# Patient Record
Sex: Female | Born: 1970 | Race: White | Hispanic: No | Marital: Married
Health system: Southern US, Community
[De-identification: ages and names within clinical notes are randomized; demographics above are authoritative.]

## PROBLEM LIST (undated history)

## (undated) DIAGNOSIS — M1990 Unspecified osteoarthritis, unspecified site: Secondary | ICD-10-CM

## (undated) HISTORY — DX: Unspecified osteoarthritis, unspecified site: M19.90

---

## 2000-06-05 ENCOUNTER — Ambulatory Visit (HOSPITAL_COMMUNITY): Admission: RE | Admit: 2000-06-05 | Discharge: 2000-06-05 | Payer: Self-pay | Admitting: Obstetrics and Gynecology

## 2000-06-05 ENCOUNTER — Encounter (INDEPENDENT_AMBULATORY_CARE_PROVIDER_SITE_OTHER): Payer: Self-pay | Admitting: Specialist

## 2000-08-16 ENCOUNTER — Other Ambulatory Visit: Admission: RE | Admit: 2000-08-16 | Discharge: 2000-08-16 | Payer: Self-pay | Admitting: Obstetrics and Gynecology

## 2001-07-09 ENCOUNTER — Encounter: Payer: Self-pay | Admitting: Obstetrics and Gynecology

## 2001-07-09 ENCOUNTER — Ambulatory Visit (HOSPITAL_COMMUNITY): Admission: RE | Admit: 2001-07-09 | Discharge: 2001-07-09 | Payer: Self-pay | Admitting: Obstetrics and Gynecology

## 2002-03-10 ENCOUNTER — Ambulatory Visit (HOSPITAL_COMMUNITY): Admission: RE | Admit: 2002-03-10 | Discharge: 2002-03-10 | Payer: Self-pay | Admitting: Obstetrics and Gynecology

## 2002-10-26 ENCOUNTER — Other Ambulatory Visit: Admission: RE | Admit: 2002-10-26 | Discharge: 2002-10-26 | Payer: Self-pay | Admitting: Obstetrics and Gynecology

## 2003-05-18 ENCOUNTER — Inpatient Hospital Stay (HOSPITAL_COMMUNITY): Admission: AD | Admit: 2003-05-18 | Discharge: 2003-05-21 | Payer: Self-pay | Admitting: Obstetrics and Gynecology

## 2003-06-25 ENCOUNTER — Other Ambulatory Visit: Admission: RE | Admit: 2003-06-25 | Discharge: 2003-06-25 | Payer: Self-pay | Admitting: Obstetrics and Gynecology

## 2004-06-23 ENCOUNTER — Other Ambulatory Visit: Admission: RE | Admit: 2004-06-23 | Discharge: 2004-06-23 | Payer: Self-pay | Admitting: Obstetrics and Gynecology

## 2005-06-28 ENCOUNTER — Other Ambulatory Visit: Admission: RE | Admit: 2005-06-28 | Discharge: 2005-06-28 | Payer: Self-pay | Admitting: Obstetrics and Gynecology

## 2006-09-06 ENCOUNTER — Inpatient Hospital Stay (HOSPITAL_COMMUNITY): Admission: RE | Admit: 2006-09-06 | Discharge: 2006-09-09 | Payer: Self-pay | Admitting: Obstetrics and Gynecology

## 2008-09-15 ENCOUNTER — Encounter: Admission: RE | Admit: 2008-09-15 | Discharge: 2008-09-15 | Payer: Self-pay | Admitting: Sports Medicine

## 2010-09-22 NOTE — H&P (Signed)
   NAME:  Leslie [name], Leslie [name]                       ACCOUNT NO.:  [id number]   MEDICAL RECORD NO.:  [id number]                   PATIENT TYPE:  AMB   LOCATION:  SDC                                  FACILITY:  [facility]   PHYSICIAN:  Dineen Kid. Rana Snare, M.D.                 DATE OF BIRTH:  1970/12/30   DATE OF ADMISSION:  03/10/2002  DATE OF DISCHARGE:                                HISTORY & PHYSICAL   HISTORY OF PRESENT ILLNESS:  The patient is a 40 year old G1 P0 with  worsening dysmenorrhea currently trying to conceive, does not want to take  oral contraceptive agents, is taking anti-inflammatory medications with her  menses, desires definitive laparoscopic evaluation and then treatment of  this for possible endometriosis.  She does have a history of right ovarian  cyst in April, appears to have resolved.  She does continue to have a lot of  pelvic pain and pressure with her cycles.   PHYSICAL EXAMINATION:  VITAL SIGNS: Blood pressure is 102/60.  HEART: Regular rate and rhythm.  LUNGS: Clear to auscultation bilaterally.  ABDOMEN: Nondistended, nontender.  PELVIC: Uterus is anteverted, mobile, nontender.  No adnexal masses are  palpable.  No uterosacral nodularity.   MEDICATIONS:  None - prenatal vitamins otherwise.   ALLERGIES:  She is allergic to SULFA.   IMPRESSION AND PLAN:  Pelvic pain and dysmenorrhea not responsive to  conservative medical management.  She desires fertility and not a good  candidate for birth control pills at this time.  Plan laparoscopic  evaluation and treatment.  We will also plan chromopertubation at the time  despite having had a normal HSG in the past.  Risks and benefits were  discussed at length which include but not limited to risk of infection,  bleeding, damage to uterus, tubes, ovaries, bowel or bladder.  Informed  consent was obtained.                                               Dineen Kid Rana Snare, M.D.    DCL/MEDQ  D:  03/10/2002  T:  03/10/2002   Job:  [id number]

## 2010-09-22 NOTE — Op Note (Signed)
Leslie [name], Leslie [name] NO.:  [id number]   MEDICAL RECORD NO.:  [id number]          PATIENT TYPE:  INP   LOCATION:  9199                          FACILITY:  [facility]   PHYSICIAN:  Dineen Kid. Rana Snare, M.D.    DATE OF BIRTH:  Mar 26, 1971   DATE OF PROCEDURE:  09/06/2006  DATE OF DISCHARGE:                               OPERATIVE REPORT   PREOPERATIVE DIAGNOSES:  1. Intrauterine pregnancy at 39-1/2 weeks' gestational age.  2. Unstable fetal presentation.  3. Maternal desire for primary cesarean section.   PREOPERATIVE DIAGNOSES:  1. Intrauterine pregnancy at 39-1/2 weeks' gestational age.  2. Unstable fetal presentation.  3. Maternal desire for primary cesarean section.   PROCEDURE:  Primary low segment transverse section.   SURGEON:  Dr. Candice Camp.   ANESTHESIA:  Spinal.   INDICATIONS:  [name] is a 40 year old G3, P1 at 39-1/2 weeks seen  yesterday in the office for routine obstetrical care and found to have  transverse presentation.  She declines version attempt.  She presents  today for primary cesarean section.  Upon presentation, ultrasound shows  oblique to vertex presentation.  The patient was offered induction of  labor but desires to proceed with primary cesarean section due to stable  fetal presentation.  The risks and benefits were discussed.  Informed  consent was obtained   FINDINGS AT THE TIME OF SURGERY:  Viable female infant, Apgars were 8  and 9, pH arterial 7.28, weight 6 pounds 15 ounces.   DESCRIPTION OF PROCEDURE:  After adequate analgesia, the patient placed  in the supine position with left lateral tilt.  She was sterilely  prepped and draped.  The bladder sterilely drained with a Foley  catheter.  Pfannenstiel skin incision was made two fingerbreadths above  the pubic symphysis, taken down sharply to fascia which was incised  transversely, extended superiorly and inferiorly off the bellies of the  rectus muscle which was separated sharply  in the midline.  Peritoneum  was entered sharply.  Bladder flap created and placed behind the bladder  blade.  A low segment myotomy incision was made down to the infant's  vertex and extended laterally with the operator's fingertips.  Vertex  was delivered atraumatically.  The nares and pharynx suctioned.  Infant  was then delivered, cord clamped and cut, handed to the pediatrician for  resuscitation.  Cord blood was obtained.  Placenta extracted manually.  Uterus was exteriorized, wiped clean with a dry lap.  The myotomy  incision closed in 2 layers, first being a running locking layer, the  second being an imbricating layer of 0 Monocryl suture.  Uterus was  placed back in the peritoneal cavity, and after a copious amount of  irrigation, adequate hemostasis was assured.  The peritoneum was closed  with 0 Monocryl.  Rectus muscle plicated in the midline.  Irrigation was  applied, and after adequate hemostasis, the fascia was then closed with  a 0 PDS in running fashion.  Irrigation was applied, and after adequate  hemostasis was assured, the skin staples and Steri-Strips applied.  The  patient tolerated the procedure  well, was stable on transfer to the  recovery room.  Sponge and instrument count was normal x3.  Estimated  blood loss 600 mL.  She did receive 1 grams Rocephin after delivery of  the placenta.      Dineen Kid Rana Snare, M.D.  Electronically Signed     DCL/MEDQ  D:  09/06/2006  T:  09/06/2006  Job:  [id number]

## 2010-09-22 NOTE — Op Note (Signed)
[facility]  Patient:    Leslie [name], Leslie [name]                    MRN: [id number] Proc. Date: 06/05/00 Adm. Date:  [id number] Attending:  Trevor Iha                           Operative Report  PREOPERATIVE DIAGNOSIS:       Missed abortion, approximately 6-1/2 weeks size.  POSTOPERATIVE DIAGNOSIS:      Missed abortion, approximately 6-1/2 weeks size.  PROCEDURE:                    Dilation and evacuation.  SURGEON:                      Trevor Iha, M.D.  ANESTHESIA:                   Monitored anesthetic care and paracervical                               block.  ESTIMATED BLOOD LOSS:         20 cc.  INDICATIONS:                  [name] is a 40 year old, G1, P0, with vaginal bleeding. Beta HCGs were not appropriately rising. Ultrasound shows a 6-1/2 week size gestational sac, no fetal pole or heartbeat. She presents today for dilation and evacuation. Risks and benefits were discussed at length. Informed consent was obtained.  DESCRIPTION OF PROCEDURE:            After adequate analgesia, the patient was placed in the dorsal lithotomy position. She was sterilely prepped and draped, the bladder was sterilely drained. Paracervical block was placed and the anterior lip was injected with 1% Xylocaine with epinephrine. Tenaculum was placed, uterus was sounded to 9 cm, easily dilated to a #27 Hegar dilator. A 7 mm suction curet was inserted and the products of conception were retrieved. This was performed until it was felt the endometrial cavity was empty as evidenced by a gritty surface felt throughout the endometrial cavity, followed by general sharp curettage. A second pass of the suction curet retrieved no more products of conception. At this time the tenaculum was removed. A small laceration on the anterior lip of the cervix was made hemostatic with a suture of 0 chromic with good approximation and good hemostasis. The patient  received Methergine 0.2 mg IM and Toradol 30 mg IV. The patient was stable and transferred to recovery room. Estimated blood loss was 20 cc.  DISPOSITION:                  The patient is to be discharged home. Follow up in the office in two to three weeks. She is sent home with a routine instruction sheet for D&E, prescription for Methergine 0.2 mg to take every eight hours for two days, and doxycycline 100 mg to take twice a day for seven days. DD:  06/05/00 TD:  06/05/00 Job: [id number]

## 2010-09-22 NOTE — H&P (Signed)
[facility]  Patient:    Leslie [name], Leslie [name]                    MRN: [id number] Adm. Date:  [id number] Attending:  Trevor Iha                         History and Physical  HISTORY OF PRESENT ILLNESS:   [name] is a 40 year old, G1, P0, at approximately 7 weeks estimated gestational age who originally presented two weeks ago with abnormal bleeding.  Beta HCG had doubled over approximately two days.  Ultrasound at that time was too early and she presented again one week ago.  Beta HCG at that point was not doubling any further.  Ultrasound at that time showed a 5 week, 6 day gestational sac, no fetal pole or heart activity. Today, she had an ultrasound which showed a 6 week, 3 day gestational sac but no fetal pole or fetal heart activity consistent with a missed abortion.  She presents now for dilation evacuation.  She has had some vaginal bleeding and cramping.  Her blood type is O positive.  PHYSICAL EXAMINATION:         Uterus is anteverted, mobile, approximately 6 to 8 weeks size.  Cervix is closed.  Bleeding is minimal.  IMPRESSION AND PLAN:          Missed abortion, approximately 6-1/2 weeks estimated gestational size.  Discussed risks and benefits at length included but not limited to risk of infection, bleeding, damage to uterus, tubes, ovaries, bowel, or bladder.  Blood type again is O positive.  She gives her informed consent. DD:  06/05/00 TD:  06/05/00 Job: [id number]

## 2010-09-22 NOTE — H&P (Signed)
NAMEASTIN, RAPE NO.:  [id number]   MEDICAL RECORD NO.:  [id number]          PATIENT TYPE:  INP   LOCATION:  9199                          FACILITY:  [facility]   PHYSICIAN:  Dineen Kid. Rana Snare, M.D.    DATE OF BIRTH:  Oct 09, 1970   DATE OF ADMISSION:  09/06/2006  DATE OF DISCHARGE:                              HISTORY & PHYSICAL   HISTORY OF PRESENT ILLNESS:  Leslie [name] is a 40 year old G3, P1, A1 at  39-4/7 weeks with an [facility] of Sep 09, 2006 who presents today for primary  cesarean section.  She was seen in the office yesterday, had transverse  lie of the fetus and desires primary cesarean section.  Her pregnancy  has otherwise been uncomplicated.  She does have group B strep.  Upon  presentation to the hospital, an ultrasound was performed which does  show the fetus now in the oblique to vertex presentation, but because of  unstable lie, the patient does want to proceed with primary cesarean  section.   PAST MEDICAL HISTORY:  See [place].   PAST SURGICAL HISTORY:  See [place].   ALLERGIES:  SULFA.   MEDICATIONS:  Prenatal vitamins.   PHYSICAL EXAMINATION:  VITAL SIGNS:  Blood pressure 120/76, fetal heart  rate is in the 140s.  PELVIC:  The cervix was 1, 50%, and high.   IMPRESSION AND PLAN:  Intrauterine pregnancy at 39-1/[redacted] weeks gestational  age with unstable fetal presentation.  The patient was offered version  and induction of labor versus cesarean section.  At this time she  desires primary low segment cesarean section.  The risks and benefits of  the procedure were discussed, and informed consent was obtained.      Dineen Kid Rana Snare, M.D.  Electronically Signed     DCL/MEDQ  D:  09/06/2006  T:  09/06/2006  Job:  [id number]

## 2010-09-22 NOTE — Op Note (Signed)
NAME:  Leslie [name], Leslie [name]                       ACCOUNT NO.:  [id number]   MEDICAL RECORD NO.:  [id number]                   PATIENT TYPE:  AMB   LOCATION:  SDC                                  [facility]   PHYSICIAN:  Dineen Kid. Rana Snare, M.D.                 DATE OF BIRTH:  1970-12-30   DATE OF PROCEDURE:  03/10/2002  DATE OF DISCHARGE:                                 OPERATIVE REPORT   PREOPERATIVE DIAGNOSIS:  Dysmenorrhea and pelvic pain.   POSTOPERATIVE DIAGNOSES:  1. Dysmenorrhea and pelvic pain.  2. Pelvic adhesions.   PROCEDURE:  Laparoscopy with lysis of adhesions and chromopertubation.   SURGEON:  Dineen Kid. Rana Snare, M.D.   ANESTHESIA:  General endotracheal.   INDICATIONS:  The patient is a 40 year old G1, P0, A1 with worsening pelvic  pain and dysmenorrhea not responsive to conservative medical management.  She presents for definitive evaluation and treatment surgically.  Risks and  benefits were discussed.  Informed consent was obtained.   FINDINGS:  Findings at the time of surgery were left ovary to pelvic side  wall adhesions, also descending colon, adhesions to the anterior abdominal  wall and the left tubo-ovarian complex, and also the cecum to the anterior  abdominal wall, otherwise normal appearing right tube and ovary, normal  appearing uterus, uterosacral area and cul-de-sac were normal as was the  bladder.  Also normal appearing gallbladder and liver.   DESCRIPTION OF PROCEDURE:  After adequate analgesia, the patient was placed  in the dorsal lithotomy position.  She was sterilely prepped and draped.  The bladder was sterilely drained.  A Cohen tenaculum was placed on the  cervix.  A 1 cm infraumbilical skin incision was made.  A [place] was  inserted.  The abdomen was insufflated with dullness to percussion.  An 11  mm trocar was then inserted as was the diagnostic laparoscope, and the above  findings were noted.  A 5 mm port was placed to the left of midline  two  fingerbreadths above the pubic symphysis under direct visualization.  Lysis  of adhesions was carried out with bipolar cautery and Endoshears, first of  the descending colon with care taken to avoid the underlying blood vessels  and also the lumen of the bowel.  Good hemostasis was achieved, and the  colon was removed from the anterior abdominal wall as well as the side wall  and the tubo-ovarian complex with good hemostasis achieve.  The cecum was  also adhered to the anterior abdominal wall.  This was taken down sharply  and hemostasis achieved with bipolar cautery with good hemostasis achieved  and care taken to avoid the underlying blood vessels and bowel.  At this  time, the left ovary was grasped, cauterized of adhesions between the  ovarian fossa.  Sharp Endoshears were used to free the left ovary from the  pelvic sidewall.  Bleeding from the inferior portion  of the ovary where a  cyst was ruptured was cauterized with bipolar cautery.  Hemostasis was used  to cauterize the adhesion remnants in the left ovarian fossa with good  hemostasis and secured to avoid the underlying blood vessels and ureter.  The ureter was noted to be peristalsing normal in the left ovarian fossa as  it was in the right ovarian fossa.  At this point after a copious amount of  irrigation, adequate hemostasis was assured.  Chromopertubation was  performed and good flow through both fallopian tubes and normal appearing  fimbria.  At this time, the abdomen was desufflated.  The trocars were  removed.  The infraumbilical skin incision was closed with a 0 Vicryl on a  UR needle in an interrupted fashion.  The skin was then closed with 3-0  Vicryl Rapide in a subcuticular stitch in the 5 mm trocar site.  The  incisions were injected with 0.25% Marcaine.  The tenaculum was removed from  the cervix and noted to be hemostatic.  The patient was stable on transfer  to recovery.  Sponge and needle counts were correct  x3.  Estimated blood  loss was less than 10 cc.   DISPOSITION:  The patient was discharged home with a prescription for  Darvocet #30 and instructions for routine laparoscopy and told to follow up  in the next three weeks in the office.                                               Dineen Kid Rana Snare, M.D.    DCL/MEDQ  D:  03/10/2002  T:  03/10/2002  Job:  [id number]

## 2010-09-22 NOTE — Discharge Summary (Signed)
[name], [name] NO.:  [id number]   MEDICAL RECORD NO.:  [id number]          PATIENT TYPE:  INP   LOCATION:  9136                          FACILITY:  [facility]   PHYSICIAN:  Juluis Mire, M.D.   DATE OF BIRTH:  1971/01/01   DATE OF ADMISSION:  09/06/2006  DATE OF DISCHARGE:  09/09/2006                               DISCHARGE SUMMARY   ADMITTING DIAGNOSES:  1. Intrauterine pregnancy at 37-4/7 weeks, estimated gestational age.  2. Transverse lie, desires primary cesarean delivery.   DISCHARGE DIAGNOSES:  1. Status post low transverse cesarean section.  2. Viable female infant.   PROCEDURE:  Primary low transverse cesarean section.   REASON FOR ADMISSION:  Please see dictated H&P.   HOSPITAL COURSE:  The patient is a 40 year old, gravida 3, para 1 that  was admitted to [facility] for scheduled cesarean  section.  The patient had been seen in the office on the day prior to  admission with fetus noted to be in the transverse presentation.  The  patient had declined a version.  On admission, ultrasound was performed  to confirm transverse presentation.  The fetus was now noted to be in  the vertex presentation.  The patient was offered induction of labor.  However, she had declined and decided to proceed with a primary cesarean  section due to unstable fetal presentation.  The patient was then  transferred to the operating room where spinal anesthesia was  administered without difficulty.  Low transverse incision was made with  delivery of a viable female infant weighing 6 pounds 15 ounces, Apgars  of 8 at 1 minute and 9 at 5 minutes.  Arterial cord pH of 7.28.  The  patient tolerated procedure well and taken to the recovery room in  stable condition.   On postoperative day #1, the patient was without complaint.  Vital signs  were stable.  Abdomen soft with good return of bowel function. Abdominal  dressing was noted to be clean, dry and  intact.  Laboratory findings  were pending.   On postoperative day #2, the patient was without complaint.  Vital signs  remained stable.  Abdomen soft.  Abdominal dressing had been removed  revealing an incision that is clean, dry and intact.  Laboratory  findings revealed hemoglobin of 8.6, platelet count of 285,000, WBC  3.6.   On postoperative day #3, the patient was without complaint.  Vital signs  were stable.  Fundus firm and nontender.  Incision was clean, dry and  intact.  Staples removed, and the patient was later discharged home.   CONDITION ON DISCHARGE:  Stable.   DIET:  Regular as tolerated.   ACTIVITY:  No heavy lifting, no driving x2 weeks.  No vaginal entry.   FOLLOWUP:  Patient to follow up in the office in 1 week for an incision  check.  She is to call for temperature greater than 100 degrees,  persistent nausea, vomiting, heavy vaginal bleeding and/or redness or  drainage from incisional site.   DISCHARGE MEDICATIONS:  1. Motrin 600 mg every 6 hours.  2. Ferrous  sulfate 325 mg one p.o. b.i.d.  3. Prenatal vitamins one p.o. daily.      Julio Sicks, N.P.      Juluis Mire, M.D.  Electronically Signed    CC/MEDQ  D:  11/02/2006  T:  11/03/2006  Job:  [id number]

## 2010-11-11 IMAGING — CR DG KNEE 1-2V*R*
2 series · 2 of 2 positions shown · non-contrast
Comparison: None

CLINICAL DATA: Right knee pain and swelling; history of surgery

RIGHT KNEE - 2 VIEW

[view not recorded (1 of 2)]
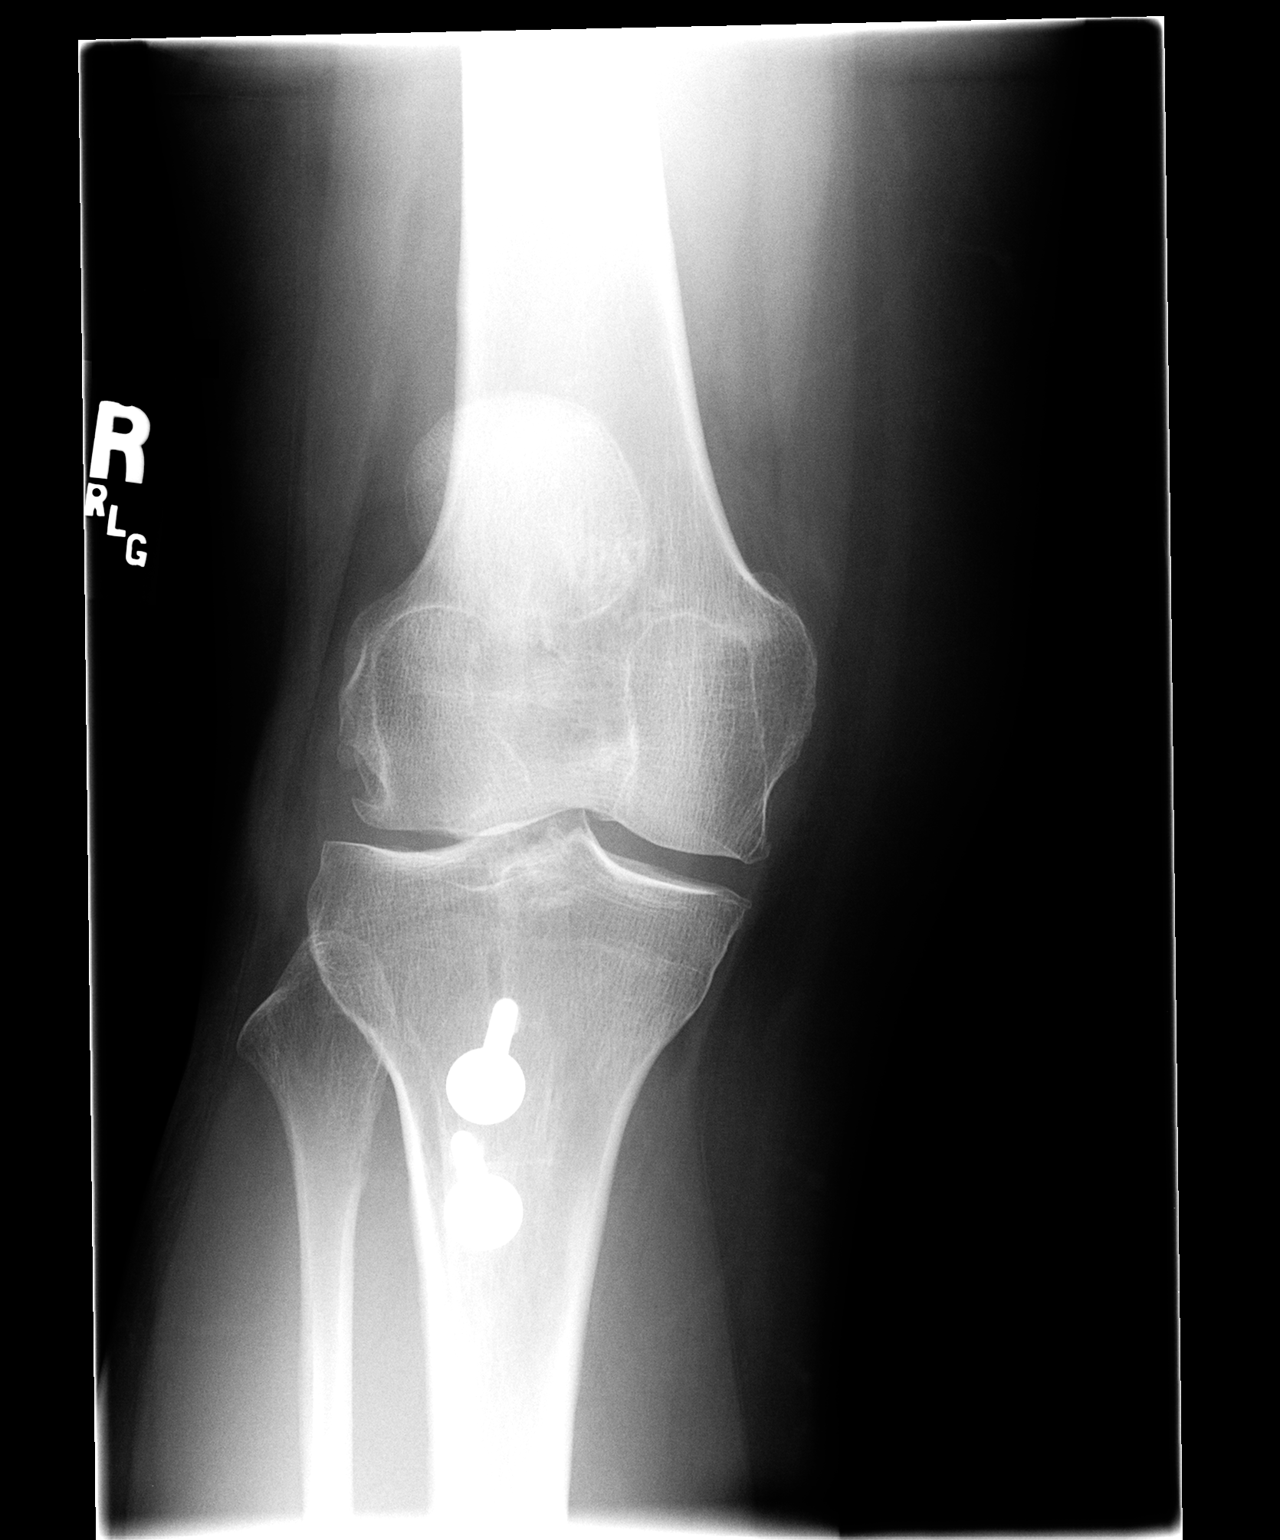

[view not recorded (2 of 2)]
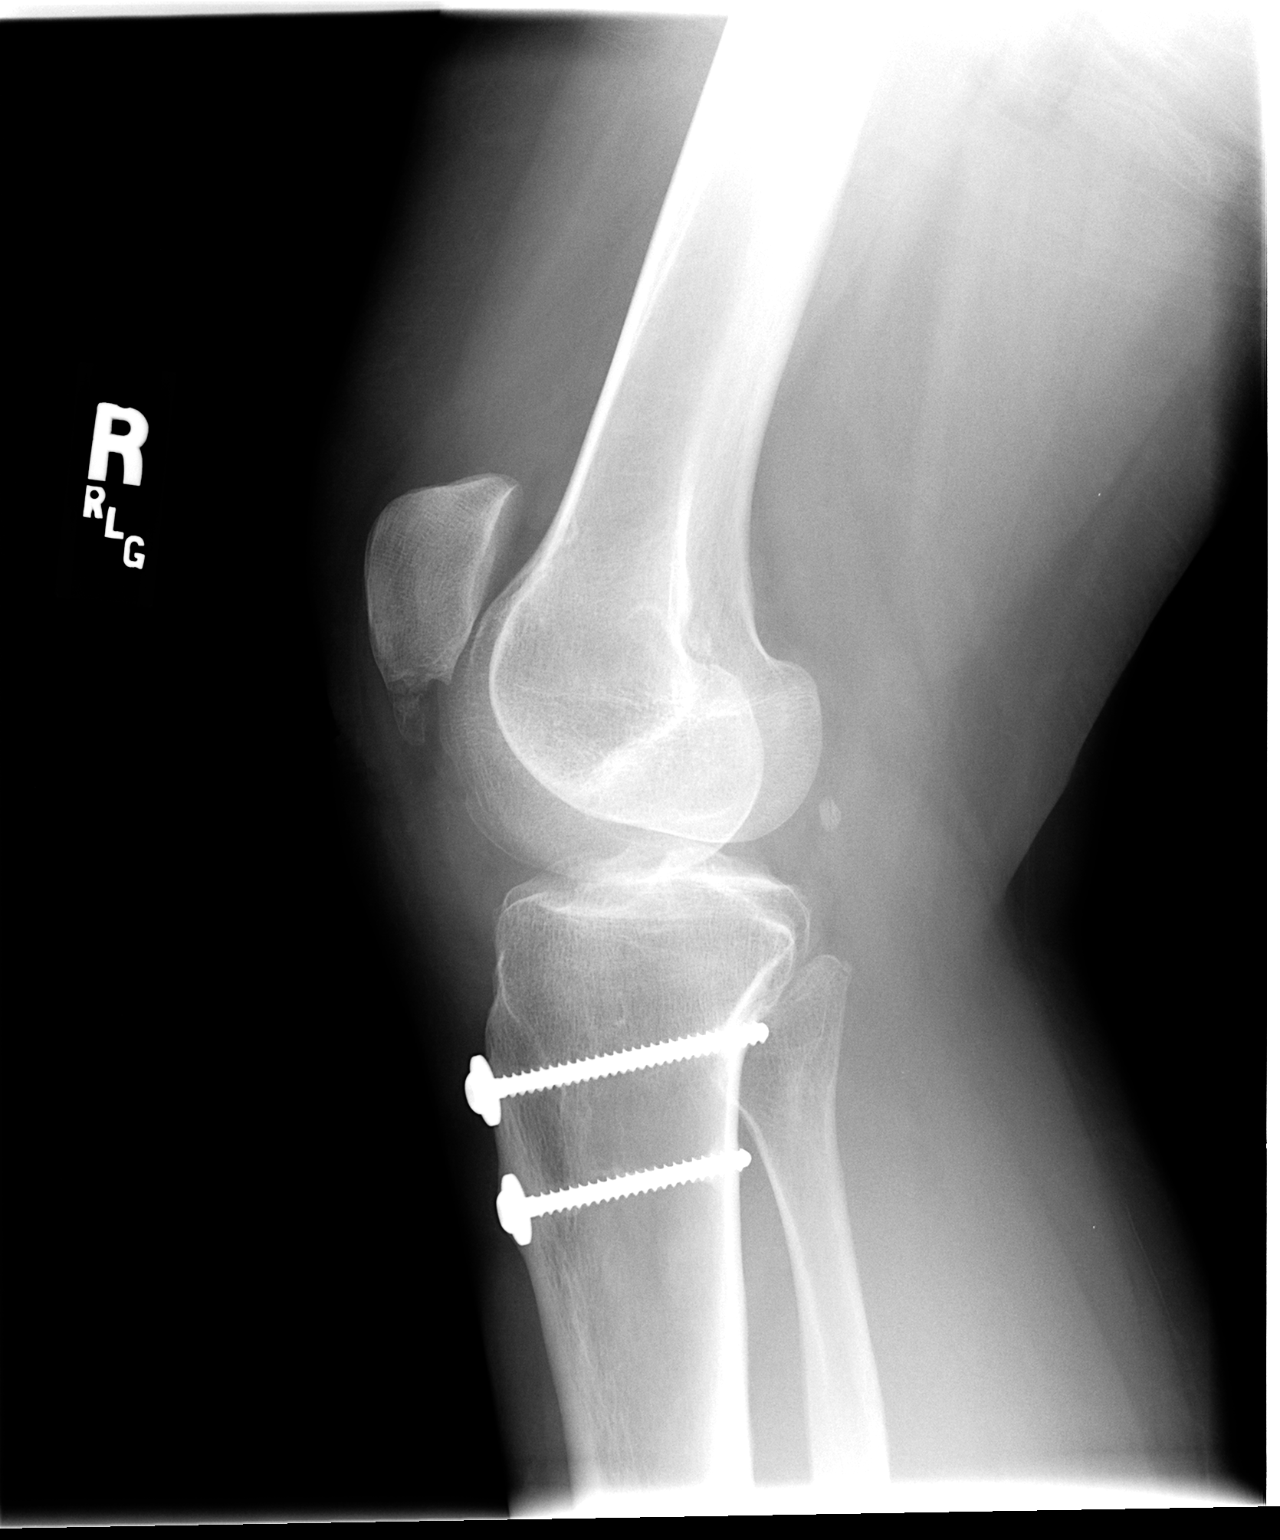

[2 of 2 positions shown; findings below may reference images not displayed]

FINDINGS: Two proximal tibial screws, one entering the anterior
tibial tuberosity and the other just distal to that site.  The
screws extend from the anterior to the posterior cortex.  No
evidence of screw loosening.  No joint space narrowing.  Mild
osteophytic formation of the patella, femur, and tibia, mainly
medially. No joint effusion.
IMPRESSION: Mild degenerative OA changes.

## 2013-01-22 ENCOUNTER — Other Ambulatory Visit: Payer: Self-pay | Admitting: Obstetrics and Gynecology

## 2013-01-22 DIAGNOSIS — R928 Other abnormal and inconclusive findings on diagnostic imaging of breast: Secondary | ICD-10-CM

## 2013-01-28 ENCOUNTER — Ambulatory Visit
Admission: RE | Admit: 2013-01-28 | Discharge: 2013-01-28 | Disposition: A | Discharge status: Home / Self Care | Payer: 59 | Source: Ambulatory Visit | Attending: Obstetrics and Gynecology | Admitting: Obstetrics and Gynecology

## 2013-01-28 DIAGNOSIS — R928 Other abnormal and inconclusive findings on diagnostic imaging of breast: Secondary | ICD-10-CM

## 2015-03-26 IMAGING — MG MM DIAGNOSTIC UNILATERAL R
2 series · 2 of 2 positions shown · non-contrast
Comparison: With priors

CLINICAL DATA: Abnormal right screening mammogram

DIGITAL DIAGNOSTIC RIGHT MAMMOGRAM

[R CC]
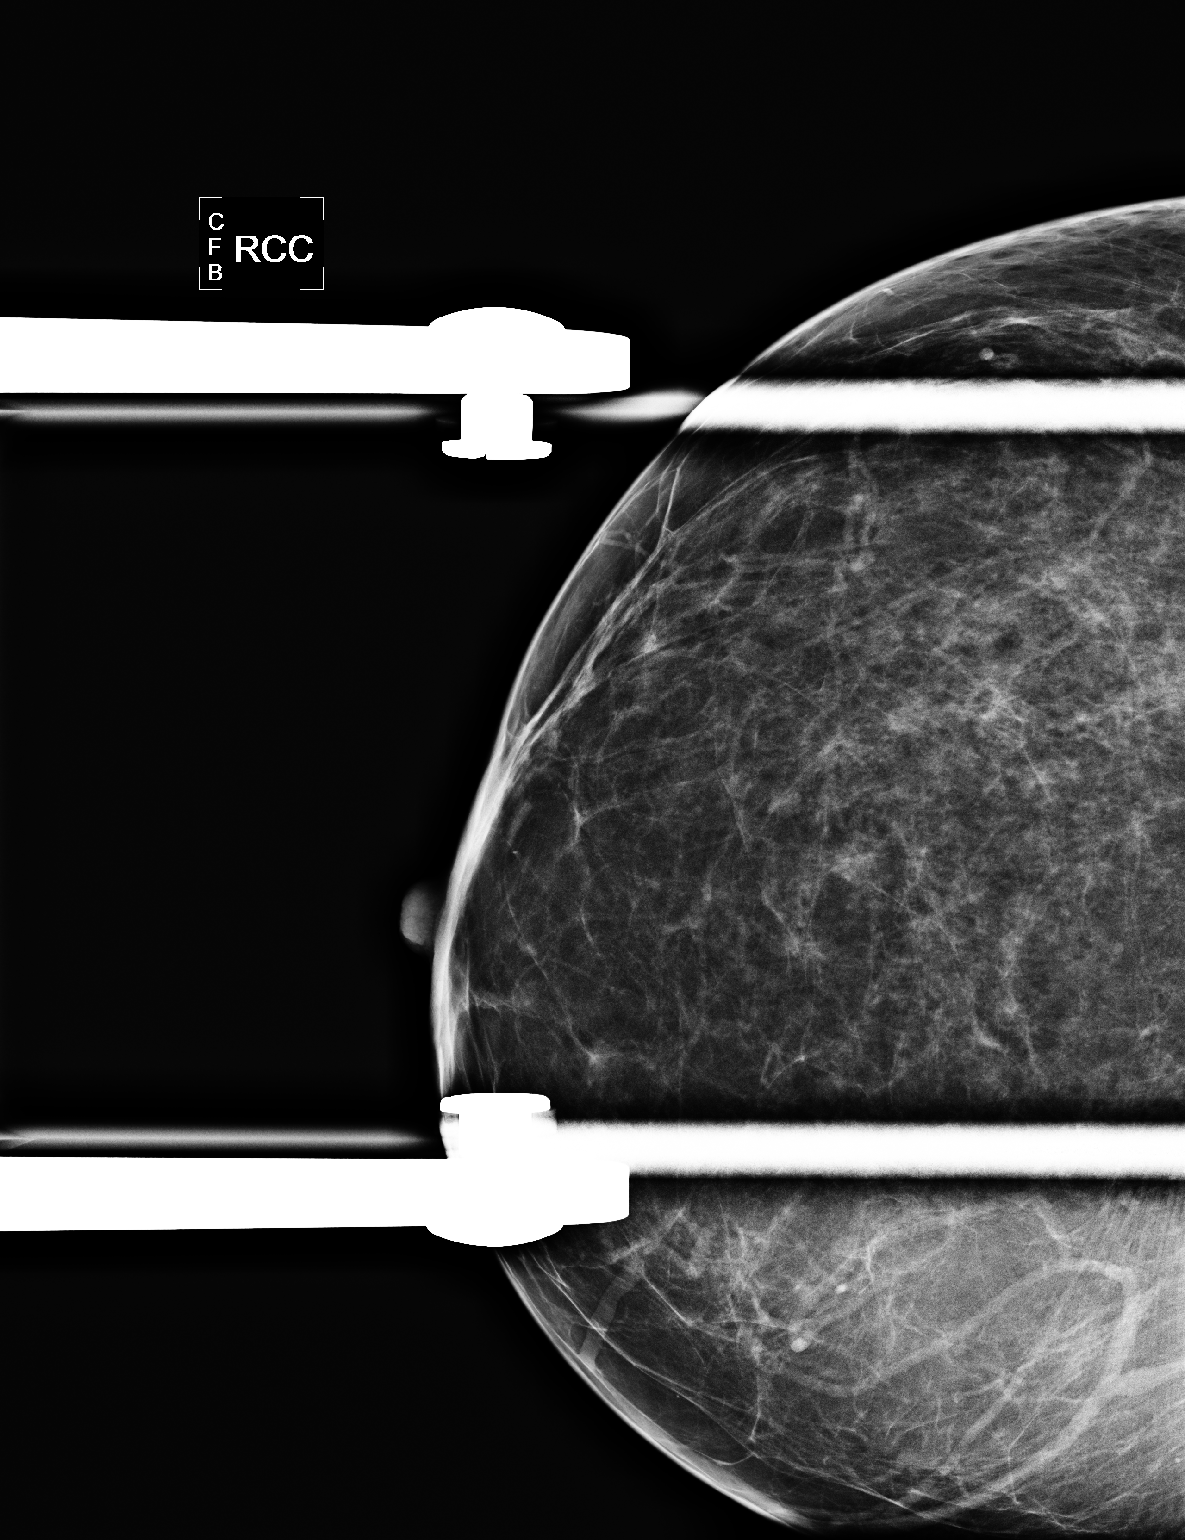

[R MLO]
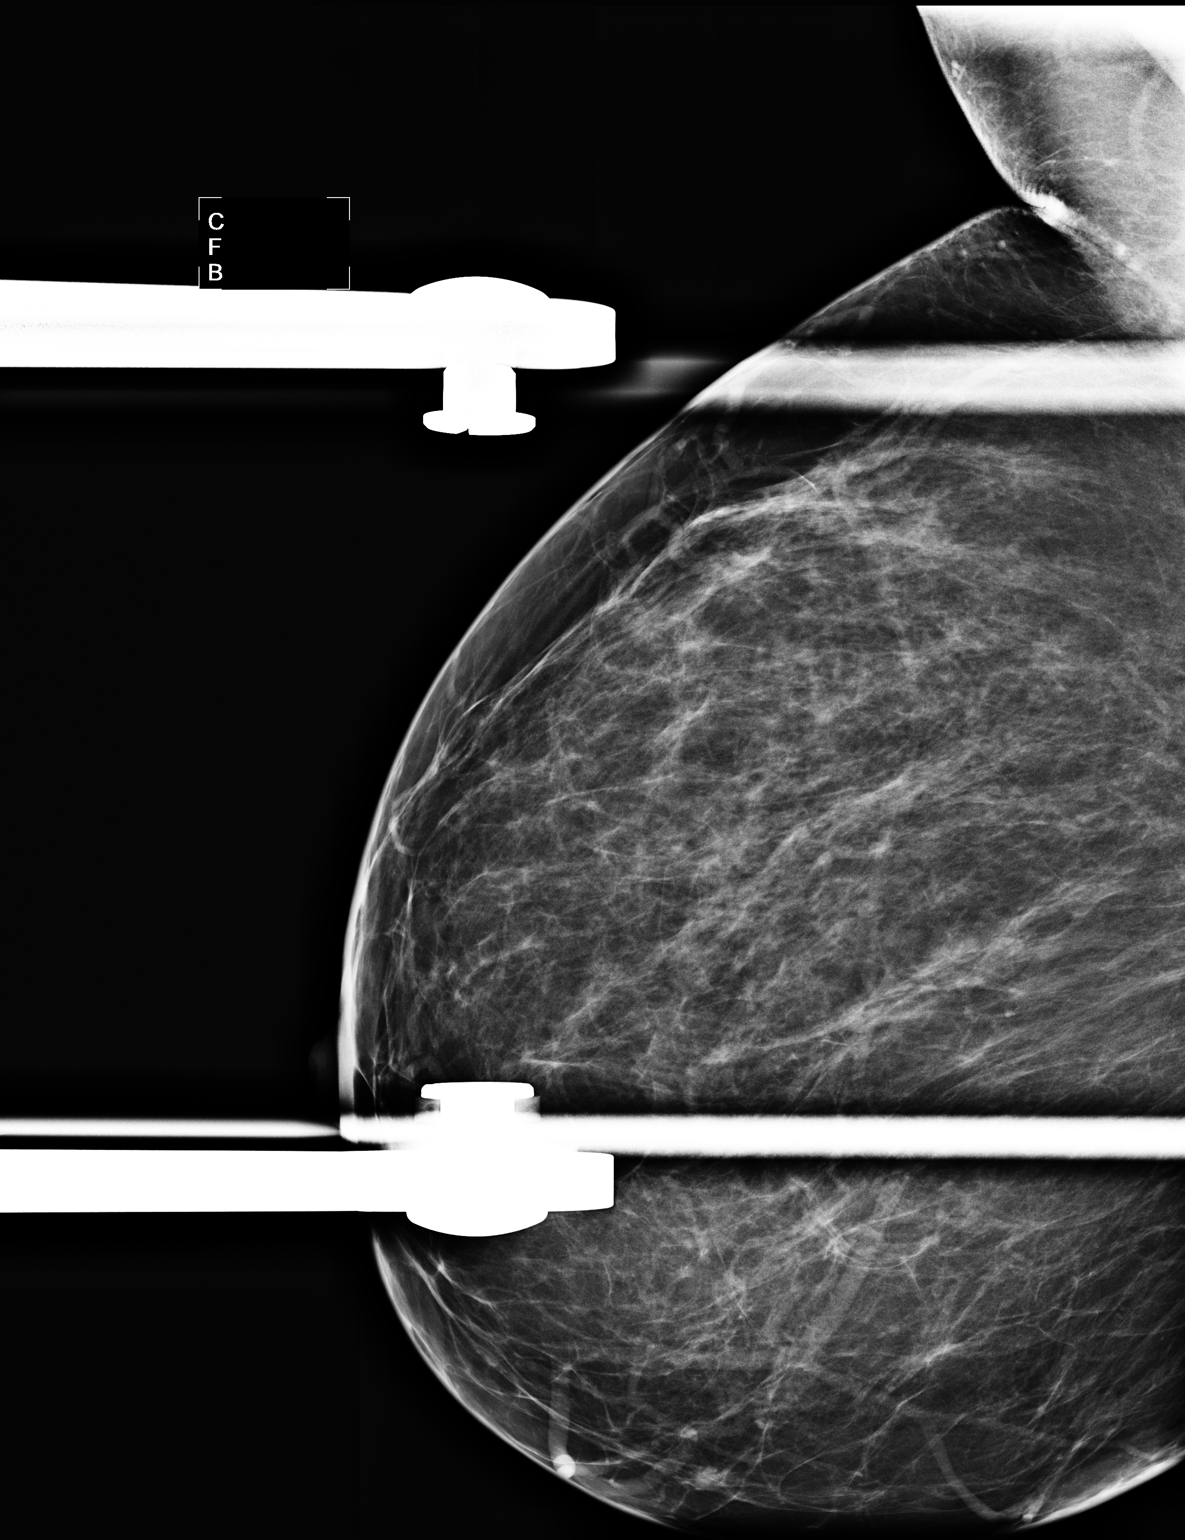

[2 of 2 positions shown; findings below may reference images not displayed]

FINDINGS: ACR Breast Density Category b:  There are scattered areas of
fibroglandular density.

Spot compression views of the right breast were obtained.  There is
no persistent mass, distortion or malignant-type
microcalcifications.
IMPRESSION: No evidence of malignancy in the right breast.

RECOMMENDATION:
Bilateral screening mammogram in 1 year is recommended.

I have discussed the findings and recommendations with the patient.
Results were also provided in writing at the conclusion of the
visit.  If applicable, a reminder letter will be sent to the
patient regarding her next appointment.

BI-RADS CATEGORY 1:  Negative.

## 2017-11-03 DIAGNOSIS — I214 Non-ST elevation (NSTEMI) myocardial infarction: Secondary | ICD-10-CM | POA: Insufficient documentation

## 2019-09-04 DIAGNOSIS — M1990 Unspecified osteoarthritis, unspecified site: Secondary | ICD-10-CM | POA: Insufficient documentation

## 2020-11-17 DIAGNOSIS — Z6835 Body mass index (BMI) 35.0-35.9, adult: Secondary | ICD-10-CM | POA: Diagnosis not present

## 2020-11-17 DIAGNOSIS — Z01419 Encounter for gynecological examination (general) (routine) without abnormal findings: Secondary | ICD-10-CM | POA: Diagnosis not present

## 2020-11-17 DIAGNOSIS — Z1231 Encounter for screening mammogram for malignant neoplasm of breast: Secondary | ICD-10-CM | POA: Diagnosis not present

## 2020-11-30 DIAGNOSIS — Z1211 Encounter for screening for malignant neoplasm of colon: Secondary | ICD-10-CM | POA: Diagnosis not present

## 2021-09-26 DIAGNOSIS — S39013A Strain of muscle, fascia and tendon of pelvis, initial encounter: Secondary | ICD-10-CM | POA: Diagnosis not present

## 2021-09-26 DIAGNOSIS — S39012A Strain of muscle, fascia and tendon of lower back, initial encounter: Secondary | ICD-10-CM | POA: Diagnosis not present

## 2021-09-26 DIAGNOSIS — M9905 Segmental and somatic dysfunction of pelvic region: Secondary | ICD-10-CM | POA: Diagnosis not present

## 2021-09-26 DIAGNOSIS — M9903 Segmental and somatic dysfunction of lumbar region: Secondary | ICD-10-CM | POA: Diagnosis not present

## 2022-01-10 DIAGNOSIS — Z6839 Body mass index (BMI) 39.0-39.9, adult: Secondary | ICD-10-CM | POA: Diagnosis not present

## 2022-01-10 DIAGNOSIS — Z1231 Encounter for screening mammogram for malignant neoplasm of breast: Secondary | ICD-10-CM | POA: Diagnosis not present

## 2022-01-10 DIAGNOSIS — Z01419 Encounter for gynecological examination (general) (routine) without abnormal findings: Secondary | ICD-10-CM | POA: Diagnosis not present

## 2022-07-05 ENCOUNTER — Ambulatory Visit (INDEPENDENT_AMBULATORY_CARE_PROVIDER_SITE_OTHER): Payer: BC Managed Care – PPO

## 2022-07-05 ENCOUNTER — Ambulatory Visit (INDEPENDENT_AMBULATORY_CARE_PROVIDER_SITE_OTHER): Payer: BC Managed Care – PPO | Admitting: Sports Medicine

## 2022-07-05 ENCOUNTER — Encounter: Payer: Self-pay | Admitting: Sports Medicine

## 2022-07-05 DIAGNOSIS — M1711 Unilateral primary osteoarthritis, right knee: Secondary | ICD-10-CM

## 2022-07-05 DIAGNOSIS — M1712 Unilateral primary osteoarthritis, left knee: Secondary | ICD-10-CM | POA: Diagnosis not present

## 2022-07-05 DIAGNOSIS — Z09 Encounter for follow-up examination after completed treatment for conditions other than malignant neoplasm: Secondary | ICD-10-CM

## 2022-07-05 MED ORDER — MELOXICAM 15 MG PO TABS: ORAL_TABLET | ORAL | 3 refills | Status: AC

## 2022-07-05 NOTE — Assessment & Plan Note (Signed)
Pleasant 52 year old female, long history of right knee pain, she does have osteoarthritis, she has what sounds to be MPFL reconstruction x 2, has never had a knee injection. She has been doing ibuprofen but unfortunately has persistent pain. At this point she is unable to fully extend the knee, flexion to just past 90 degrees but a positive McMurray's sign. She does want conservative treatment initially and declines injection today so we will do meloxicam, x-rays, formal PT. Return to see me in 6 weeks, we will do an injection if not better.

## 2022-07-05 NOTE — Progress Notes (Signed)
    Procedures performed today:    None.  Independent interpretation of notes and tests performed by another provider:   None.  Brief History, Exam, Impression, and Recommendations:    Primary osteoarthritis of right knee Pleasant 52 year old female, long history of right knee pain, she does have osteoarthritis, she has what sounds to be MPFL reconstruction x 2, has never had a knee injection. She has been doing ibuprofen but unfortunately has persistent pain. At this point she is unable to fully extend the knee, flexion to just past 90 degrees but a positive McMurray's sign. She does want conservative treatment initially and declines injection today so we will do meloxicam, x-rays, formal PT. Return to see me in 6 weeks, we will do an injection if not better.  Chronic process with exacerbation and pharmacologic intervention  ____________________________________________ Gwen Her. Dianah Field, M.D., ABFM., CAQSM., AME. Primary Care and Sports Medicine [facility]  Adjunct Professor of [employer]  Risk manager

## 2023-02-19 DIAGNOSIS — Z01419 Encounter for gynecological examination (general) (routine) without abnormal findings: Secondary | ICD-10-CM | POA: Diagnosis not present

## 2023-02-19 DIAGNOSIS — Z6839 Body mass index (BMI) 39.0-39.9, adult: Secondary | ICD-10-CM | POA: Diagnosis not present

## 2023-02-19 DIAGNOSIS — Z1231 Encounter for screening mammogram for malignant neoplasm of breast: Secondary | ICD-10-CM | POA: Diagnosis not present

## 2023-10-05 DIAGNOSIS — J019 Acute sinusitis, unspecified: Secondary | ICD-10-CM | POA: Diagnosis not present

## 2023-12-11 DIAGNOSIS — H52223 Regular astigmatism, bilateral: Secondary | ICD-10-CM | POA: Diagnosis not present

## 2023-12-11 DIAGNOSIS — H524 Presbyopia: Secondary | ICD-10-CM | POA: Diagnosis not present

## 2023-12-11 DIAGNOSIS — Z135 Encounter for screening for eye and ear disorders: Secondary | ICD-10-CM | POA: Diagnosis not present

## 2023-12-11 DIAGNOSIS — H21233 Degeneration of iris (pigmentary), bilateral: Secondary | ICD-10-CM | POA: Diagnosis not present

## 2024-01-07 ENCOUNTER — Encounter: Payer: Self-pay | Admitting: Sports Medicine

## 2024-02-03 DIAGNOSIS — Z8679 Personal history of other diseases of the circulatory system: Secondary | ICD-10-CM | POA: Diagnosis not present

## 2024-02-03 DIAGNOSIS — Z131 Encounter for screening for diabetes mellitus: Secondary | ICD-10-CM | POA: Diagnosis not present

## 2024-02-03 DIAGNOSIS — Z6838 Body mass index (BMI) 38.0-38.9, adult: Secondary | ICD-10-CM | POA: Diagnosis not present

## 2024-02-03 DIAGNOSIS — D649 Anemia, unspecified: Secondary | ICD-10-CM | POA: Diagnosis not present

## 2024-02-03 DIAGNOSIS — Z1329 Encounter for screening for other suspected endocrine disorder: Secondary | ICD-10-CM | POA: Diagnosis not present

## 2024-02-03 DIAGNOSIS — Z7689 Persons encountering health services in other specified circumstances: Secondary | ICD-10-CM | POA: Diagnosis not present

## 2024-02-03 DIAGNOSIS — I10 Essential (primary) hypertension: Secondary | ICD-10-CM | POA: Diagnosis not present

## 2024-02-03 DIAGNOSIS — Z13220 Encounter for screening for lipoid disorders: Secondary | ICD-10-CM | POA: Diagnosis not present

## 2024-02-03 DIAGNOSIS — E669 Obesity, unspecified: Secondary | ICD-10-CM | POA: Diagnosis not present

## 2024-02-03 DIAGNOSIS — Z780 Asymptomatic menopausal state: Secondary | ICD-10-CM | POA: Diagnosis not present

## 2024-03-09 DIAGNOSIS — E538 Deficiency of other specified B group vitamins: Secondary | ICD-10-CM | POA: Diagnosis not present

## 2024-03-09 DIAGNOSIS — D649 Anemia, unspecified: Secondary | ICD-10-CM | POA: Diagnosis not present

## 2024-03-09 DIAGNOSIS — I10 Essential (primary) hypertension: Secondary | ICD-10-CM | POA: Diagnosis not present

## 2024-03-09 DIAGNOSIS — R252 Cramp and spasm: Secondary | ICD-10-CM | POA: Diagnosis not present
